# Patient Record
Sex: Male | Born: 2000 | Hispanic: Yes | Marital: Single | Smoking: Never smoker
Health system: Southern US, Community
[De-identification: ages and names within clinical notes are randomized; demographics above are authoritative.]

## PROBLEM LIST (undated history)

## (undated) HISTORY — PX: APPENDECTOMY: SHX54

## (undated) HISTORY — PX: WISDOM TOOTH EXTRACTION: SHX21

---
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
## 2022-05-13 IMAGING — CT CT MAXILLOFACIAL W/O CM
3 series · 16 of 47 positions shown, 19 images · non-contrast
Comparison: None.

CLINICAL DATA: Facial trauma right inferior orbital rim. Eye injury
striking eye with an iron board while removing a tree stump.

EXAM:
CT MAXILLOFACIAL WITHOUT CONTRAST
TECHNIQUE: Multidetector CT imaging of the maxillofacial structures was
performed. Multiplanar CT image reconstructions were also generated.

[Series 2: max soft · axial · 0.38mm/px · z∈[+542,+700]mm · 10 of 93 slices shown, 13 images]
[im 7/93  brain]
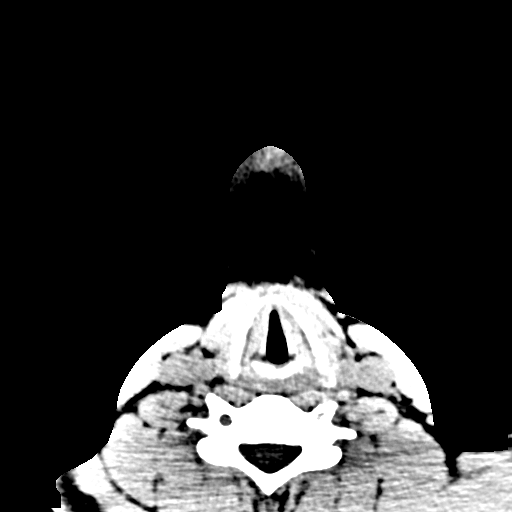
[im 7/93  bone]
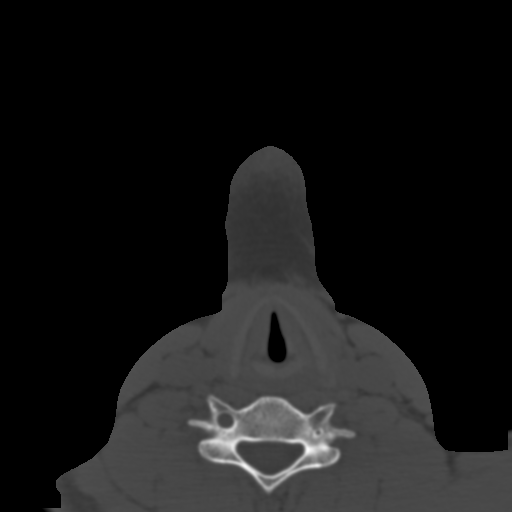
[im 16/93  bone]
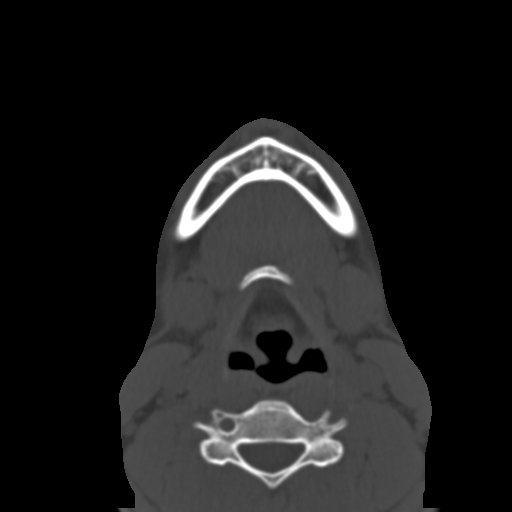
[im 26/93  bone]
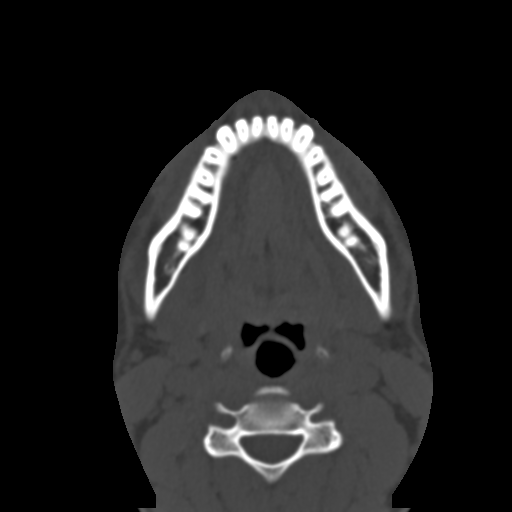
[im 32/93  bone]
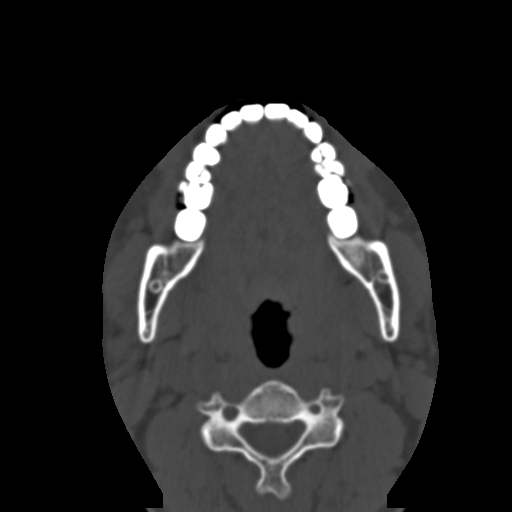
[im 42/93  brain]
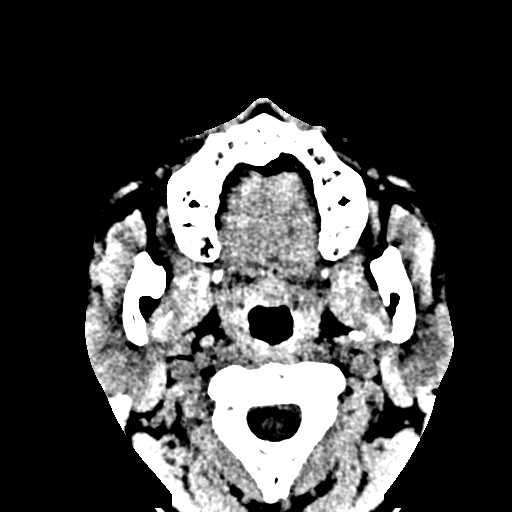
[im 42/93  bone]
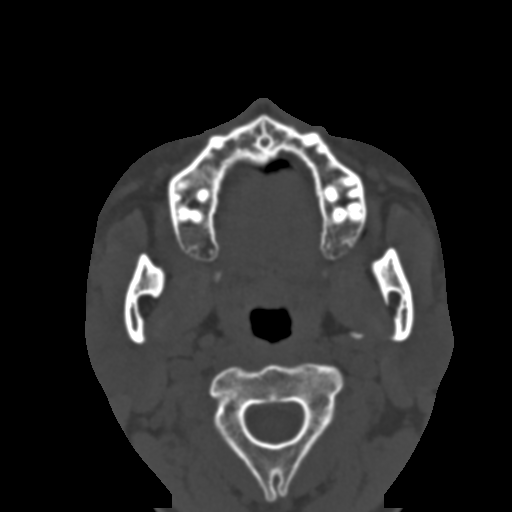
[im 51/93  bone]
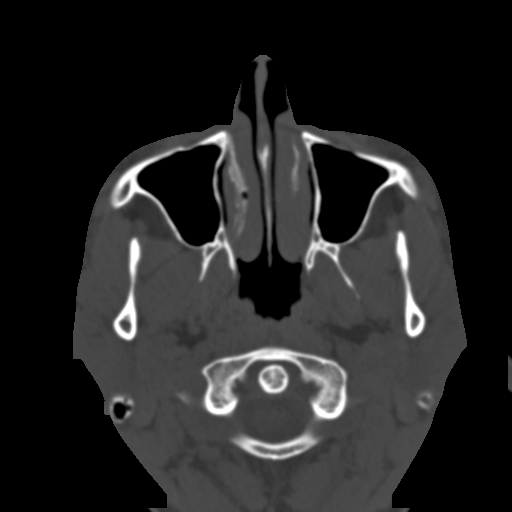
[im 61/93  bone]
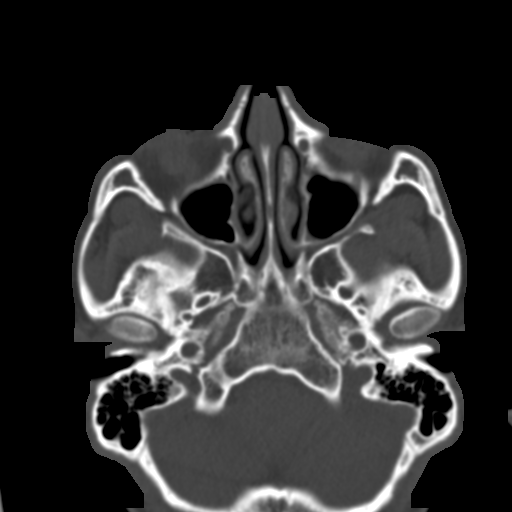
[im 70/93  bone]
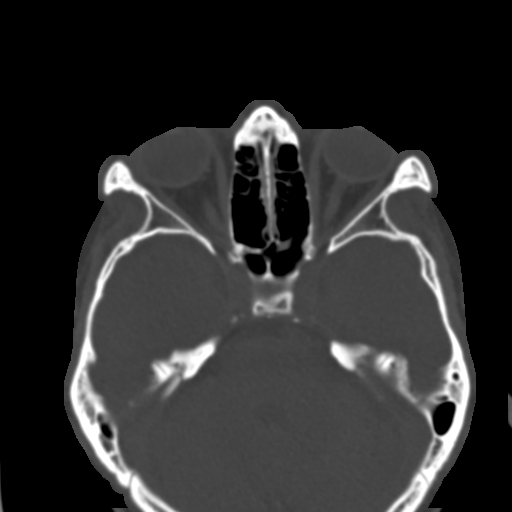
[im 77/93  brain]
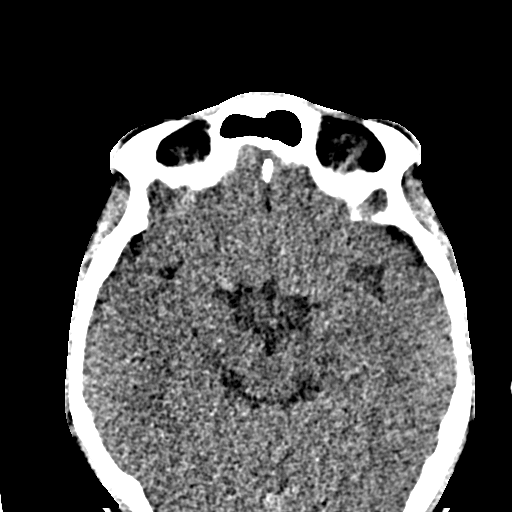
[im 77/93  bone]
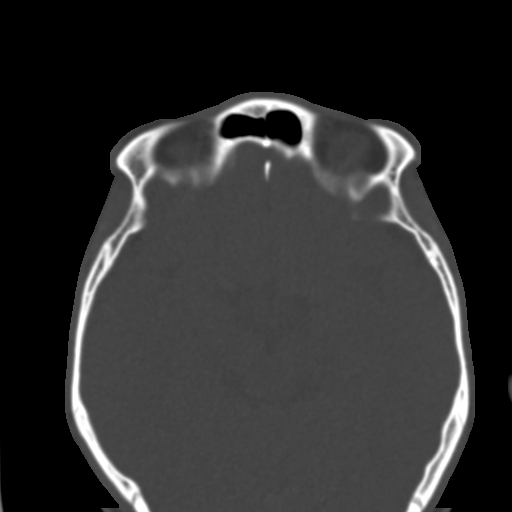
[im 86/93  bone]
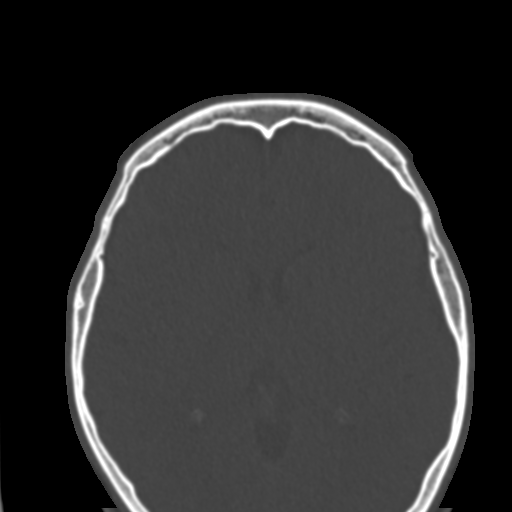

[Series 6: coronal soft · coronal · 0.39mm/px · 3 of 90 slices shown]
[im 30/90  bone]
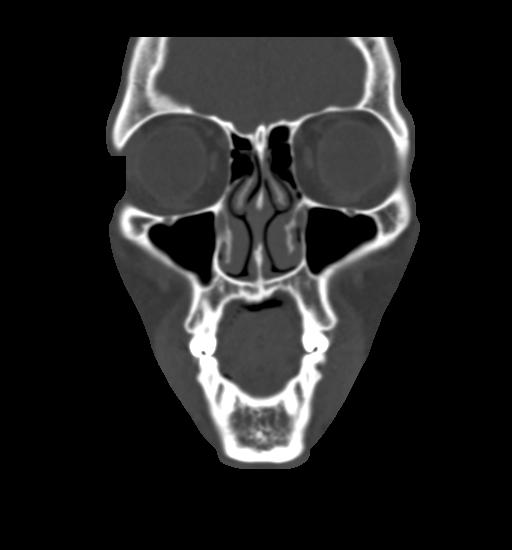
[im 40/90  bone]
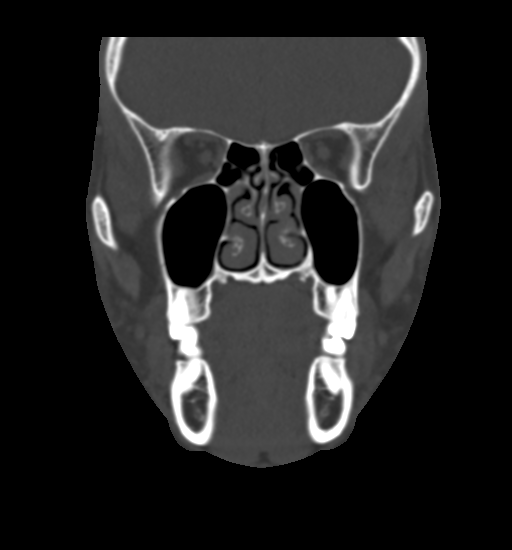
[im 50/90  bone]
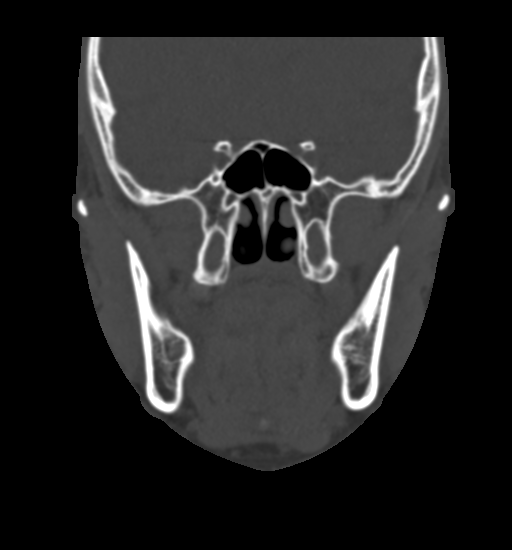

[Series 7: sagittal soft · sagittal · 0.37mm/px · 3 of 83 slices shown]
[im 28/83  bone]
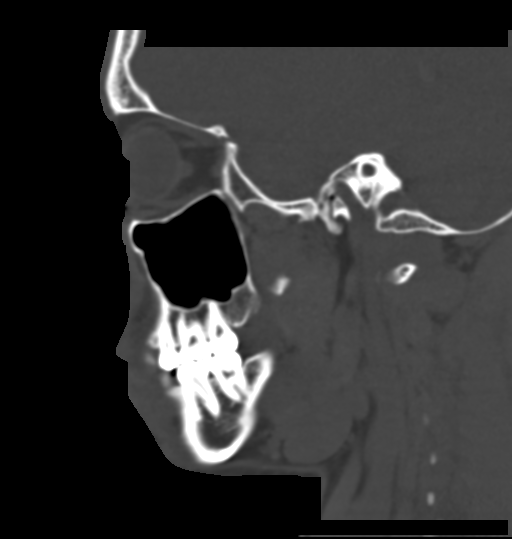
[im 42/83  bone]
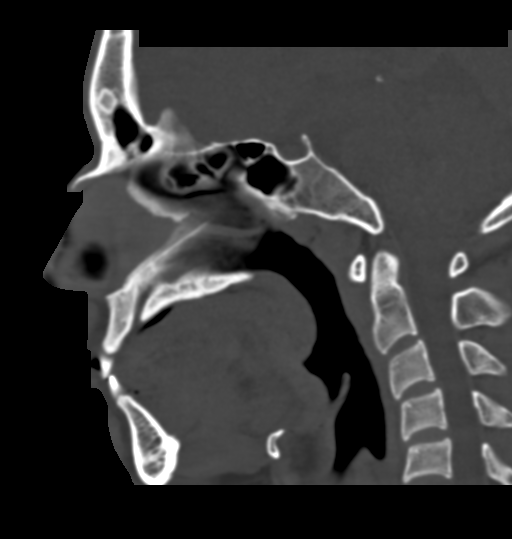
[im 55/83  bone]
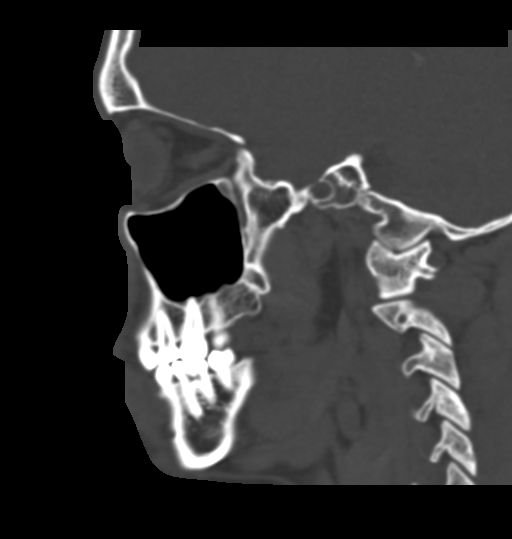

[16 of 47 positions shown; findings below may reference images not displayed]

FINDINGS: Osseous: No acute fracture of the nasal bone, zygomatic arches, or
mandibles. The temporomandibular joints are congruent. Nasal septum
is midline.

Orbits: There is no orbital fracture, particularly, right orbit is
intact. No globe injury. There is scattered air and edema in the
right inferior orbital soft tissues.

Sinuses: No sinus fracture or fluid level. Paranasal sinuses are
clear. Mastoid air cells are well aerated.

Soft tissues: Right infraorbital soft tissue edema. Small foci of
soft tissue gas in the infraorbital soft tissues. Otherwise
negative.

Limited intracranial: No significant or unexpected finding.
IMPRESSION: 1. No facial bone fracture.
2. Right infraorbital soft tissue edema and small foci of soft
tissue gas. No orbital fracture or globe injury.
